# Patient Record
Sex: Male | Born: 1985 | Race: Black or African American | Hispanic: No | Marital: Single | Smoking: Never smoker
Health system: Southern US, Community
[De-identification: ages and names within clinical notes are randomized; demographics above are authoritative.]

---

## 2007-10-15 ENCOUNTER — Emergency Department: Payer: Self-pay | Admitting: Emergency Medicine

## 2008-10-22 ENCOUNTER — Emergency Department: Payer: Self-pay | Admitting: Emergency Medicine

## 2009-12-15 IMAGING — CR RIGHT ANKLE - COMPLETE 3+ VIEW
1 series · 5 of 5 positions shown · non-contrast
Comparison: none

REASON FOR EXAM: fall
COMMENTS:

[Series 1: view not recorded · 0.17mm/px · 5 of 5 slices shown]
[im 1/5]
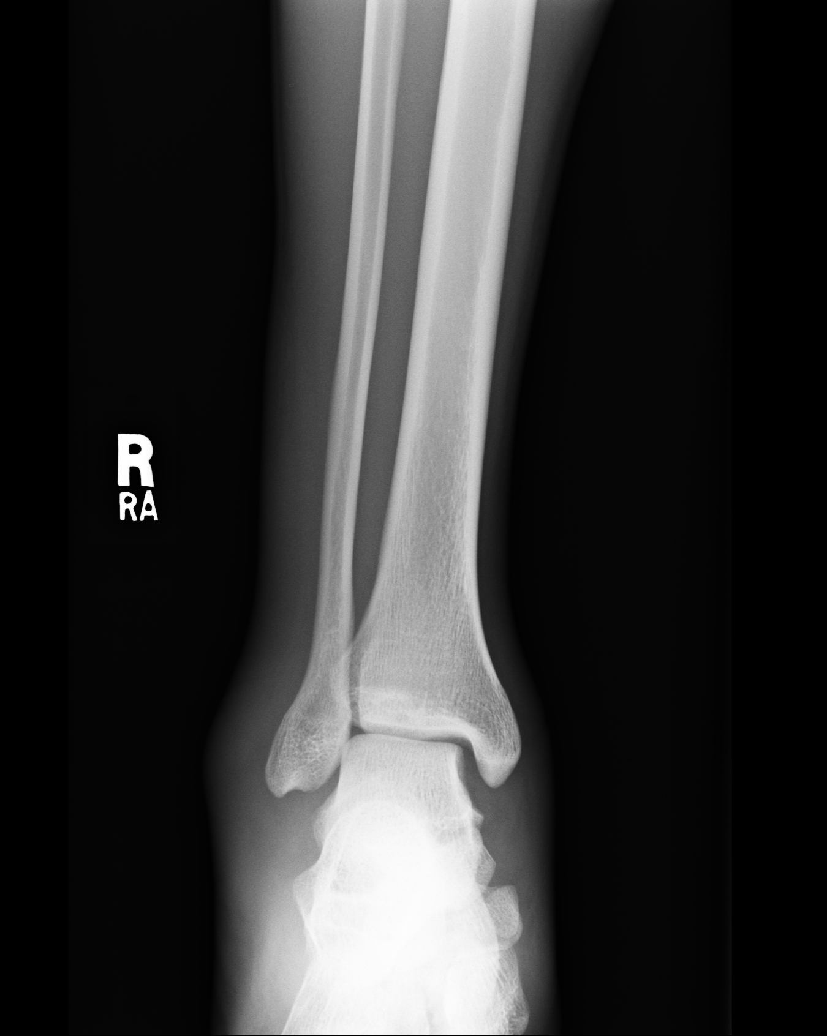
[im 2/5]
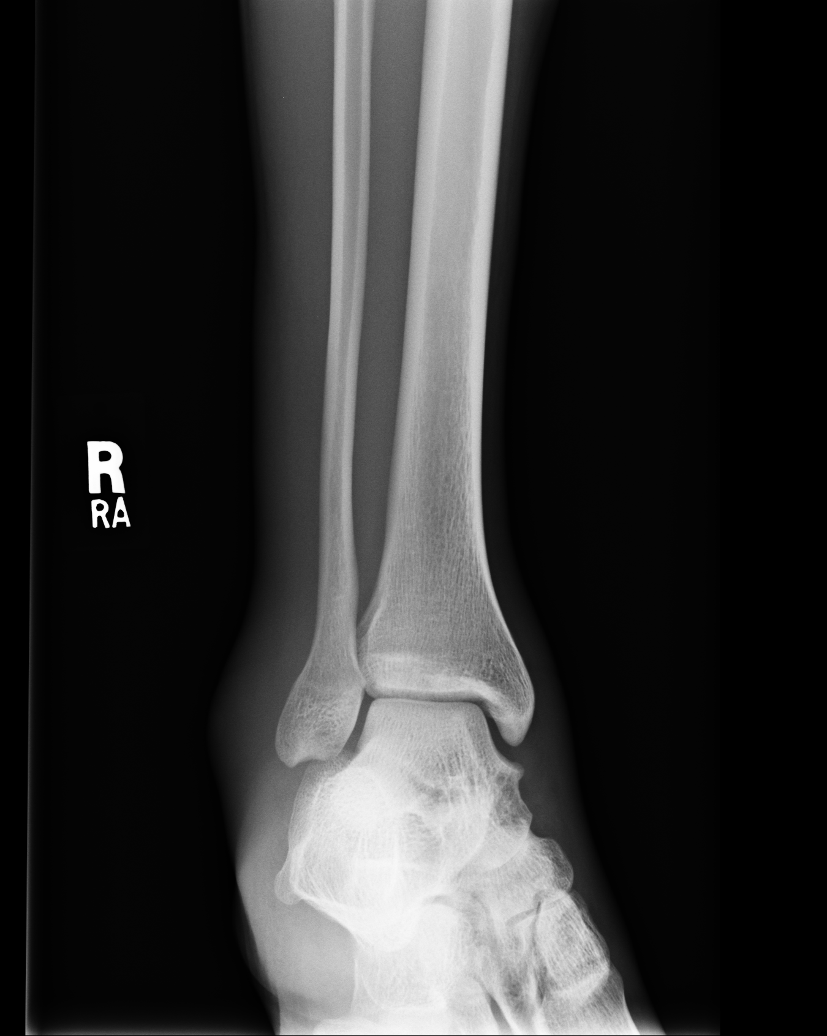
[im 3/5]
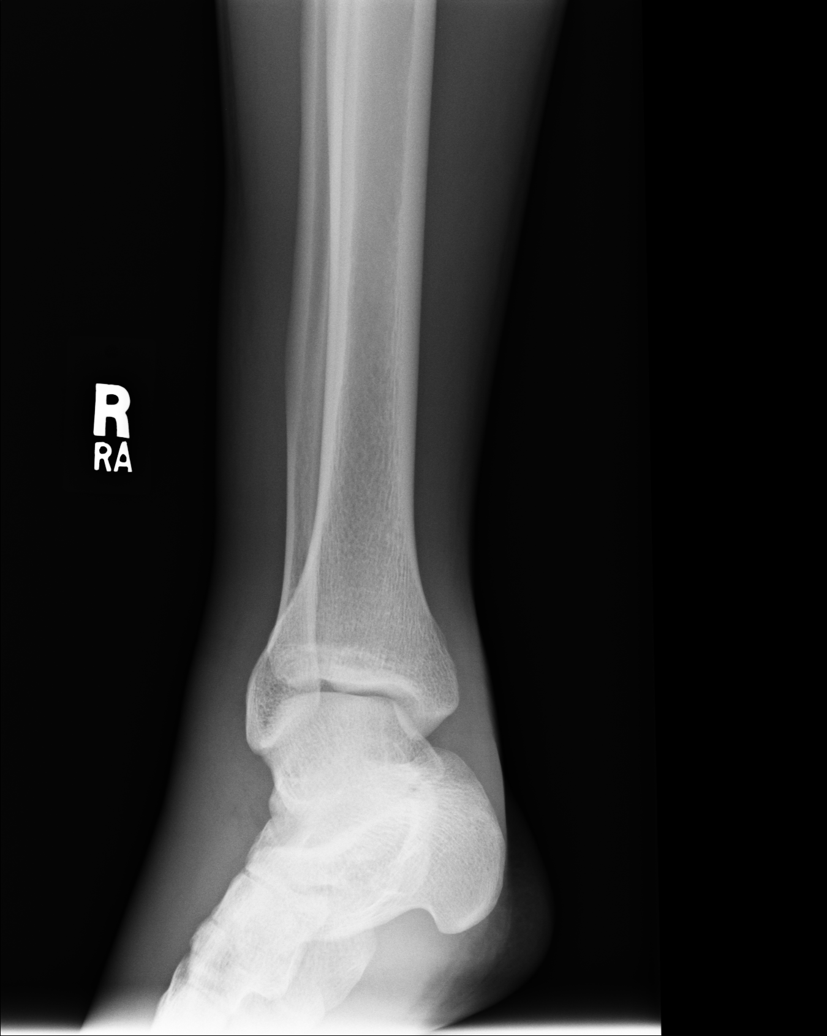
[im 4/5]
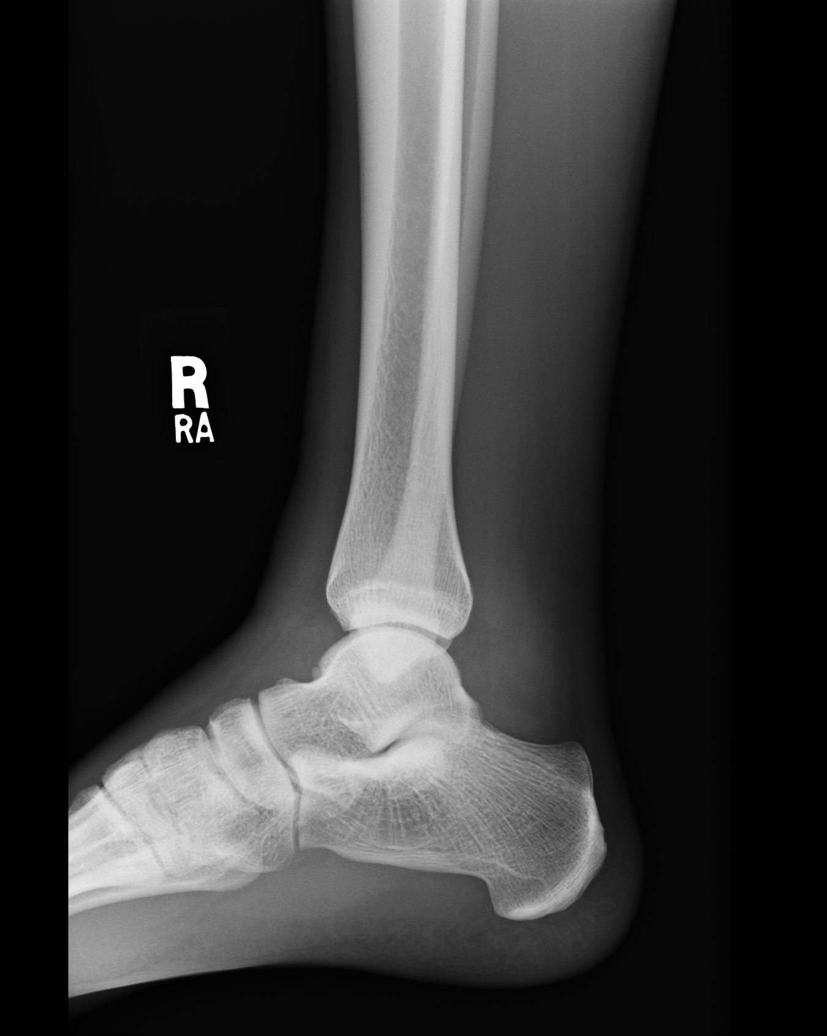
[im 5/5]
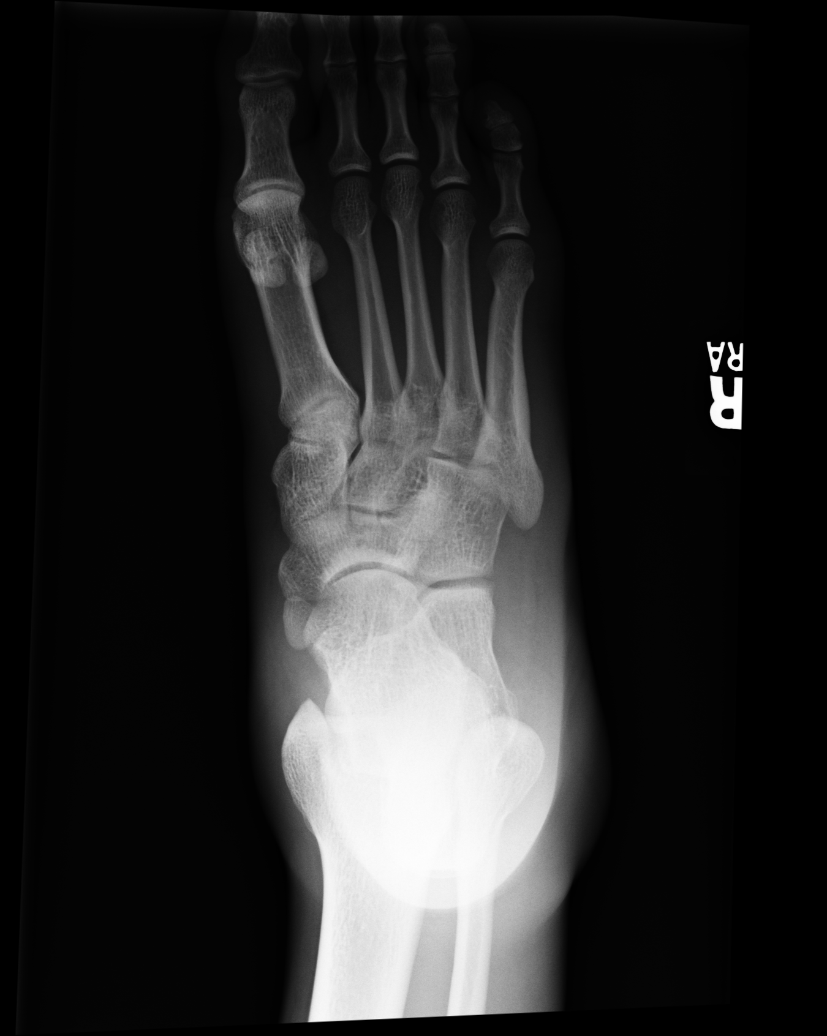

[5 of 5 positions shown; findings below may reference images not displayed]

PROCEDURE:     DXR - DXR ANKLE RIGHT COMPLETE  - October 15, 2007 [DATE]

RESULT:     There is a large amount of soft tissue swelling over the lateral
malleolus with a smaller amount over the medial malleolus. The ankle joint
mortise is mildly widened. I do not see evidence of an acute displaced
fracture. The talar dome is intact. The metatarsal bases exhibit no acute
abnormality.
IMPRESSION: I do not see evidence of an acute fracture of the RIGHT
ankle, but there is destruction of the joint mortise consistent with
underlying ligamentous injury. Followup MRI may be useful.

## 2012-06-20 ENCOUNTER — Emergency Department: Payer: Self-pay | Admitting: Unknown Physician Specialty

## 2016-03-06 ENCOUNTER — Emergency Department
Admission: EM | Admit: 2016-03-06 | Discharge: 2016-03-06 | Disposition: A | Discharge status: Home / Self Care | Payer: Self-pay | Attending: Student in an Organized Health Care Education/Training Program | Admitting: Student in an Organized Health Care Education/Training Program

## 2016-03-06 ENCOUNTER — Encounter: Payer: Self-pay | Admitting: Emergency Medicine

## 2016-03-06 DIAGNOSIS — R509 Fever, unspecified: Secondary | ICD-10-CM | POA: Insufficient documentation

## 2016-03-06 NOTE — ED Notes (Signed)
Pt alert and oriented X4, active, cooperative, pt in NAD. RR even and unlabored, color WNL.  Pt informed to return if any life threatening symptoms occur.   

## 2016-03-06 NOTE — ED Notes (Signed)
Fever since Friday.

## 2016-03-06 NOTE — ED Triage Notes (Signed)
Pt in via POV with complaints of fever and headache since Friday, taking ibuprofen and airborne at home.  Pt denies any cough/congestion, denies any pain.

## 2016-03-06 NOTE — ED Provider Notes (Signed)
[facility] Emergency Department Provider Note   ____________________________________________   None    (approximate)  I have reviewed the triage vital signs and the nursing notes.   HISTORY  Chief Complaint Fever    HPI Javier Fernandez is a 30 y.o. male patient complain of fever and headache for 3 days. Patient denies any other URI signs or symptoms. Patient denies any nausea vomiting or diarrhea. Patient state used over-the-counter ibuprofen and Tylenol with only moderate relief. Patient describes his pain as "achy". Patient denies any vertigo, weakness or vision disturbance. Patient able to tolerate food and fluids.  History reviewed. No pertinent past medical history.  There are no active problems to display for this patient.   History reviewed. No pertinent surgical history.  Prior to Admission medications   Not on File    Allergies Review of patient's allergies indicates no known allergies.  No family history on file.  Social History Social History  Substance Use Topics  . Smoking status: Never Smoker  . Smokeless tobacco: Never Used  . Alcohol use Yes     Comment: occassional     Review of Systems Constitutional: Fever/chills and body aches. Eyes: No visual changes. ENT: No sore throat. Cardiovascular: Denies chest pain. Respiratory: Denies shortness of breath. Gastrointestinal: No abdominal pain.  No nausea, no vomiting.  No diarrhea.  No constipation. Genitourinary: Negative for dysuria. Musculoskeletal: Negative for back pain. Skin: Negative for rash. Neurological: Positive for headaches, but denies focal weakness or numbness.    ____________________________________________   PHYSICAL EXAM:  VITAL SIGNS: ED Triage Vitals [03/06/16 1126]  Enc Vitals Group     BP 133/76     Pulse Rate 83     Resp 16     Temp 99 F (37.2 C)     Temp Source Oral     SpO2 100 %     Weight 170 lb (77.1 kg)     Height 6\' 2"  (1.88 m)       Head Circumference      Peak Flow      Pain Score      Pain Loc      Pain Edu?      Excl. in GC?     Constitutional: Alert and oriented. Well appearing and in no acute distress. Eyes: Conjunctivae are normal. PERRL. EOMI. Head: Atraumatic. Nose: No congestion/rhinnorhea. Mouth/Throat: Mucous membranes are moist.  Oropharynx non-erythematous. Neck: No stridor.  No cervical spine tenderness to palpation. Hematological/Lymphatic/Immunilogical: No cervical lymphadenopathy. Cardiovascular: Normal rate, regular rhythm. Grossly normal heart sounds.  Good peripheral circulation. Respiratory: Normal respiratory effort.  No retractions. Lungs CTAB. Gastrointestinal: Soft and nontender. No distention. No abdominal bruits. No CVA tenderness. Musculoskeletal: No lower extremity tenderness nor edema.  No joint effusions. Neurologic:  Normal speech and language. No gross focal neurologic deficits are appreciated. No gait instability. Skin:  Skin is warm, dry and intact. No rash noted. Psychiatric: Mood and affect are normal. Speech and behavior are normal.  ____________________________________________   LABS (all labs ordered are listed, but only abnormal results are displayed)  Labs Reviewed - No data to display ____________________________________________  EKG   ____________________________________________  RADIOLOGY   ____________________________________________   PROCEDURES  Procedure(s) performed: None  Procedures  Critical Care performed: No  ____________________________________________   INITIAL IMPRESSION / ASSESSMENT AND PLAN / ED COURSE  Pertinent labs & imaging results that were available during my care of the patient were reviewed by me and considered in my medical  decision making (see chart for details).  Viral illness. Patient given discharge care instructions. Patient given a work note. Patient advised to continue taking either Tylenol or ibuprofen as  directed for fever and headache. Patient advised follow-up with "clinic if condition persists.  Clinical Course     ____________________________________________   FINAL CLINICAL IMPRESSION(S) / ED DIAGNOSES  Final diagnoses:  Febrile illness      NEW MEDICATIONS STARTED DURING THIS VISIT:  New Prescriptions   No medications on file     Note:  This document was prepared using Dragon voice recognition software and may include unintentional dictation errors.    Joni Reiningonald K Loy Little, PA-C 03/06/16 1225    Willy EddyPatrick Robinson, MD 03/06/16 [phone]

## 2016-03-06 NOTE — Discharge Instructions (Signed)
Advised to take ibuprofen at 600-800 mg every 8 hours. Using Tylenol take 650 mg every 4-6 hours.

## 2016-03-06 NOTE — ED Notes (Signed)
CBG 88 

## 2017-05-16 ENCOUNTER — Emergency Department
Admission: EM | Admit: 2017-05-16 | Discharge: 2017-05-16 | Disposition: A | Discharge status: Home / Self Care | Payer: Self-pay | Attending: Emergency Medicine | Admitting: Emergency Medicine

## 2017-05-16 ENCOUNTER — Encounter: Payer: Self-pay | Admitting: Emergency Medicine

## 2017-05-16 ENCOUNTER — Other Ambulatory Visit: Payer: Self-pay

## 2017-05-16 DIAGNOSIS — K047 Periapical abscess without sinus: Secondary | ICD-10-CM | POA: Insufficient documentation

## 2017-05-16 MED ORDER — IBUPROFEN 800 MG PO TABS: 800.0000 mg | ORAL_TABLET | Freq: Three times a day (TID) | ORAL | 0 refills | Status: AC | PRN

## 2017-05-16 MED ORDER — TRAMADOL HCL 50 MG PO TABS: 50.0000 mg | ORAL_TABLET | Freq: Four times a day (QID) | ORAL | 0 refills | Status: AC | PRN

## 2017-05-16 MED ORDER — AMOXICILLIN 500 MG PO CAPS: 500.0000 mg | ORAL_CAPSULE | Freq: Three times a day (TID) | ORAL | 0 refills | Status: AC

## 2017-05-16 NOTE — ED Triage Notes (Signed)
Pt to ed with c/o toothache and facial swelling x 2 days.

## 2017-05-16 NOTE — Discharge Instructions (Signed)
Follow-up from list of dental clinics provided. °OPTIONS FOR DENTAL FOLLOW UP CARE ° °[place] [facility] [employer] - Local Safety Net Dental Clinics °http://www.ncdhhs.gov/dph/oralhealth/services/safetynetclinics.htm °  °[facility] (336-562-3123) ° °[facility] [place] (919-933-9087) ° °[facility] (919-663-1744 ext [phone]) ° °[facility] ([phone]) ° °[facility] ([phone]) °This clinic caters to the indigent population and is on a lottery system. °Location: °[facility], [address], [place] °Clinic Hours: °Wednesdays from 6pm - 9pm, patients seen by a lottery system. °For dates, call or go to www.med.[facility].edu/[facility]/patients/Dental-[facility] °Services: °Cleanings, fillings and simple extractions. °Payment Options: °DENTAL WORK IS FREE OF CHARGE. Bring proof of income or support. °Best way to get seen: °Arrive at 5:15 pm - this is a lottery, NOT first come/first serve, so arriving earlier will not increase your chances of being seen. °  °  °[facility] °919-537-3737 °Select option 1 for emergencies °  °Location: °[facility], [address], [place] °Clinic Hours: °No walk-ins accepted - call the day before to schedule an appointment. °Check in times are 9:30 am and 1:30 pm. °Services: °Simple extractions, temporary fillings, pulpectomy/pulp debridement, uncomplicated abscess drainage. °Payment Options: °PAYMENT IS DUE AT THE TIME OF SERVICE.  Fee is usually $100-200, additional surgical procedures (e.g. abscess drainage) may be extra. °Cash, checks, Visa/MasterCard accepted.  Can file Medicaid if patient is covered for dental - patient should call case worker to check. °No discount for [facility] patients. °Best way to get seen: °MUST call the day before and get onto the schedule. Can usually be seen the next 1-2 days. No walk-ins accepted. °  °   °[facility] °[phone] °  °Location: °[facility], [address], [place] °Clinic Hours: °M, W, Th, F 8am or 1:30pm, Tues 9a or 1:30 - first come/first served. °Services: °Simple extractions, temporary fillings, uncomplicated abscess drainage.  You do not need to be an [place] resident. °Payment Options: °PAYMENT IS DUE AT THE TIME OF SERVICE. °Dental insurance, otherwise sliding scale - bring proof of income or support. °Depending on income and treatment needed, cost is usually $50-200. °Best way to get seen: °Arrive early as it is first come/first served. °  °  °[facility] °[phone] °  °Location: °[address] °Clinic Hours: °Mon-Thu 8a-5p °Services: °Most basic dental services including extractions and fillings. °Payment Options: °PAYMENT IS DUE AT THE TIME OF SERVICE. °Sliding scale, up to 50% off - bring proof if income or support. °Medicaid with dental option accepted. °Best way to get seen: °Call to schedule an appointment, can usually be seen within 2 weeks OR they will try to see walk-ins - show up at 8a or 2p (you may have to wait). °  °  °[facility] °919-245-2435 °[place] RESIDENTS ONLY °  °Location: °[facility], [address], [place], [place] [address] °Clinic Hours: By appointment only. °Monday - Thursday 8am-5pm, Friday 8am-12pm °Services: Cleanings, fillings, extractions. °Payment Options: °PAYMENT IS DUE AT THE TIME OF SERVICE. °Cash, Visa or MasterCard. Sliding scale - $30 minimum per service. °Best way to get seen: °Come in to office, complete packet and make an appointment - need proof of income °or support monies for each household member and proof of [place] residence. °Usually takes about a month to get in. °  °  °[facility] °  [phone] °  °Location: °[address] [place] °Clinic Hours: Walk-in Urgent Care  Dental Services are offered Monday-Friday mornings only. °The numbers of emergencies accepted daily is limited to the number of °providers available. °Maximum 15 - Mondays, Wednesdays & Thursdays °Maximum 10 - Tuesdays & Fridays °Services: °You do not need to be a [place] resident to be seen for a dental emergency. °Emergencies are defined as pain, swelling, abnormal bleeding, or dental trauma. Walkins will receive x-rays if needed. °NOTE: Dental cleaning is not an emergency. °Payment Options: °PAYMENT IS DUE AT THE TIME OF SERVICE. °Minimum co-pay is $40.00 for uninsured patients. °Minimum co-pay is $3.00 for Medicaid with dental coverage. °Dental Insurance is accepted and must be presented at time of visit. °Medicare does not cover dental. °Forms of payment: Cash, credit card, checks. °Best way to get seen: °If not previously registered with the clinic, walk-in dental registration begins at 7:15 am and is on a first come/first serve basis. °If previously registered with the clinic, call to make an appointment. °  °  °[facility] °919-776-4359 °[place] RESIDENTS ONLY °  °Location: °[address], [place], [place] °Clinic Hours: °Mon-Thu 10a-2p °Services: Extractions only! °Payment Options: °FREE (donations accepted) - bring proof of income or support °Best way to get seen: °Call and schedule an appointment OR come at 8am on the 1st Monday of every month (except for holidays) when it is first come/first served. °  °  °[facility] °[phone] °  °Location: °[address], [place] °Clinic Hours: °Friday mornings °Services, Payment Options, Best way to get seen: °Call for info ° °

## 2017-05-16 NOTE — ED Provider Notes (Signed)
[facility] Emergency Department Provider Note   ____________________________________________   First MD Initiated Contact with Patient 05/16/17 1015     (approximate)  I have reviewed the triage vital signs and the nursing notes.   HISTORY  Chief Complaint Dental Pain    HPI Javier Fernandez is a 31 y.o. male patient complaining of toothache and facial swelling for 2 days. Patient has history of devitalized teeth right lower molars.Patient rates pain as 8/10. Patient got up it as "achy". No palliative measures for complaint.   History reviewed. No pertinent past medical history.  There are no active problems to display for this patient.   History reviewed. No pertinent surgical history.  Prior to Admission medications   Medication Sig Start Date End Date Taking? Authorizing Provider  amoxicillin (AMOXIL) 500 MG capsule Take 1 capsule (500 mg total) by mouth 3 (three) times daily. 05/16/17   Joni ReiningSmith, Ioannis Schuh K, PA-C  ibuprofen (ADVIL,MOTRIN) 800 MG tablet Take 1 tablet (800 mg total) by mouth every 8 (eight) hours as needed. 05/16/17   Joni ReiningSmith, Melinda Pottinger K, PA-C  traMADol (ULTRAM) 50 MG tablet Take 1 tablet (50 mg total) by mouth every 6 (six) hours as needed. 05/16/17 05/16/18  Joni ReiningSmith, Ozell Ferrera K, PA-C    Allergies Patient has no known allergies.  History reviewed. No pertinent family history.  Social History Social History   Tobacco Use  . Smoking status: Never Smoker  . Smokeless tobacco: Never Used  Substance Use Topics  . Alcohol use: Yes    Comment: occassional   . Drug use: No    Review of Systems  Constitutional: No fever/chills Eyes: No visual changes. ENT: No sore throat. Dental pain Cardiovascular: Denies chest pain. Respiratory: Denies shortness of breath. Gastrointestinal: No abdominal pain.  No nausea, no vomiting.  No diarrhea.  No constipation. Genitourinary: Negative for dysuria. Musculoskeletal: Negative for back pain. Skin:  Negative for rash. Neurological: Negative for headaches, focal weakness or numbness.   ____________________________________________   PHYSICAL EXAM:  VITAL SIGNS: ED Triage Vitals  Enc Vitals Group     BP 05/16/17 0944 131/85     Pulse Rate 05/16/17 0944 74     Resp --      Temp 05/16/17 0944 99.2 F (37.3 C)     Temp Source 05/16/17 0944 Oral     SpO2 05/16/17 0944 98 %     Weight 05/16/17 0925 170 lb (77.1 kg)     Height --      Head Circumference --      Peak Flow --      Pain Score 05/16/17 0924 8     Pain Loc --      Pain Edu? --      Excl. in GC? --    Constitutional: Alert and oriented. Well appearing and in no acute distress. Mouth/Throat: Mucous membranes are moist.  Oropharynx non-erythematous. Gingival edema and erythema tooth #27 Neck: No stridor.   Hematological/Lymphatic/Immunilogical: No cervical lymphadenopathy. Cardiovascular: Normal rate, regular rhythm. Grossly normal heart sounds.  Good peripheral circulation. Respiratory: Normal respiratory effort.  No retractions. Lungs CTAB. Skin:  Skin is warm, dry and intact. No rash noted. Psychiatric: Mood and affect are normal. Speech and behavior are normal.  ____________________________________________   LABS (all labs ordered are listed, but only abnormal results are displayed)  Labs Reviewed - No data to display ____________________________________________  EKG   ____________________________________________  RADIOLOGY  No results found.  ____________________________________________   PROCEDURES  Procedure(s) performed:  None  Procedures  Critical Care performed: No  ____________________________________________   INITIAL IMPRESSION / ASSESSMENT AND PLAN / ED COURSE  As part of my medical decision making, I reviewed the following data within the electronic MEDICAL RECORD NUMBER Notes from prior ED visits and [place] Controlled Substance Database   Dental pain secondary to abscess. Patient  given discharge care instruction. Patient given a list of dental clinics for follow-up care. Patient passed take medication as directed.      ____________________________________________   FINAL CLINICAL IMPRESSION(S) / ED DIAGNOSES  Final diagnoses:  Dental abscess     ED Discharge Orders        Ordered    amoxicillin (AMOXIL) 500 MG capsule  3 times daily     05/16/17 1022    traMADol (ULTRAM) 50 MG tablet  Every 6 hours PRN     05/16/17 1022    ibuprofen (ADVIL,MOTRIN) 800 MG tablet  Every 8 hours PRN     05/16/17 1022       Note:  This document was prepared using Dragon voice recognition software and may include unintentional dictation errors.    Joni ReiningSmith, Manali Mcelmurry K, PA-C 05/16/17 1027    Jeanmarie PlantMcShane, James A, MD 05/16/17 [phone]

## 2017-05-16 NOTE — ED Notes (Signed)
See triage note  Presents with dental pain and facial swelling for couple of days  Low grade fever noted on arrival

## 2023-11-03 ENCOUNTER — Emergency Department
Admission: EM | Admit: 2023-11-03 | Discharge: 2023-11-03 | Disposition: A | Discharge status: Home / Self Care | Attending: Emergency Medicine | Admitting: Emergency Medicine

## 2023-11-03 ENCOUNTER — Other Ambulatory Visit: Payer: Self-pay

## 2023-11-03 DIAGNOSIS — M79662 Pain in left lower leg: Secondary | ICD-10-CM

## 2023-11-03 DIAGNOSIS — M79661 Pain in right lower leg: Secondary | ICD-10-CM | POA: Insufficient documentation

## 2023-11-03 DIAGNOSIS — X501XXA Overexertion from prolonged static or awkward postures, initial encounter: Secondary | ICD-10-CM | POA: Diagnosis not present

## 2023-11-03 DIAGNOSIS — Y9367 Activity, basketball: Secondary | ICD-10-CM | POA: Diagnosis not present

## 2023-11-03 NOTE — ED Notes (Signed)
 See triage note  Presents with pain to left calf area  States he heard a pop while playing b/b yesterday  Denies any fall

## 2023-11-03 NOTE — ED Triage Notes (Signed)
 Pt states pain to L calf since yesterday while playing basketball, pt states I heard a pop. Pt ambulatory with steady gait to triage. NAD noted. Pt denies HX of blood clots.

## 2023-11-03 NOTE — ED Provider Notes (Signed)
   [facility] Provider Note    Event Date/Time   First MD Initiated Contact with Patient 11/03/23 [phone]     (approximate)   History   Leg Pain   HPI  Javier Fernandez is a 38 y.o. male who presents with complaints of right calf pain after playing basketball yesterday, he felt a pop.  He is ambulating well and without difficulty.     Physical Exam   Triage Vital Signs: ED Triage Vitals  Encounter Vitals Group     BP 11/03/23 0903 (!) 131/97     Girls Systolic BP Percentile --      Girls Diastolic BP Percentile --      Boys Systolic BP Percentile --      Boys Diastolic BP Percentile --      Pulse Rate 11/03/23 0903 77     Resp 11/03/23 0903 16     Temp 11/03/23 0903 98.2 F (36.8 C)     Temp Source 11/03/23 0903 Oral     SpO2 11/03/23 0903 100 %     Weight 11/03/23 0904 77 kg (169 lb 12.1 oz)     Height 11/03/23 0904 1.88 m (6' 2)     Head Circumference --      Peak Flow --      Pain Score 11/03/23 0904 7     Pain Loc --      Pain Education --      Exclude from Growth Chart --     Most recent vital signs: Vitals:   11/03/23 0903  BP: (!) 131/97  Pulse: 77  Resp: 16  Temp: 98.2 F (36.8 C)  SpO2: 100%     General: Awake, no distress.  CV:  Good peripheral perfusion.  Resp:  Normal effort.  Abd:  No distention.  Other:  Right calf: No significant tenderness palpation, normal Thompson test, ambulating well without difficulty   ED Results / Procedures / Treatments   Labs (all labs ordered are listed, but only abnormal results are displayed) Labs Reviewed - No data to display   EKG     RADIOLOGY     PROCEDURES:  Critical Care performed:   Procedures   MEDICATIONS ORDERED IN ED: Medications - No data to display   IMPRESSION / MDM / ASSESSMENT AND PLAN / ED COURSE  I reviewed the triage vital signs and the nursing notes. Patient's presentation is most consistent with acute, uncomplicated illness.  Patient  presents with right calf pain as detailed above, status post playing basketball, likely mild tear given he is ambulating well, reassuring exam.  Recommend supportive care, outpatient follow-up with Ortho as needed        FINAL CLINICAL IMPRESSION(S) / ED DIAGNOSES   Final diagnoses:  Pain of left calf     Rx / DC Orders   ED Discharge Orders     None        Note:  This document was prepared using Dragon voice recognition software and may include unintentional dictation errors.   Bryson Carbine, MD 11/03/23 [phone]
